# Patient Record
Sex: Female | Born: 1960 | Race: White | Hispanic: No | Marital: Single | Smoking: Never smoker
Health system: Southern US, Community
[De-identification: ages and names within clinical notes are randomized; demographics above are authoritative.]

## PROBLEM LIST (undated history)

## (undated) DIAGNOSIS — I10 Essential (primary) hypertension: Secondary | ICD-10-CM

## (undated) DIAGNOSIS — Z9289 Personal history of other medical treatment: Secondary | ICD-10-CM

## (undated) DIAGNOSIS — N950 Postmenopausal bleeding: Secondary | ICD-10-CM

## (undated) DIAGNOSIS — N840 Polyp of corpus uteri: Secondary | ICD-10-CM

## (undated) DIAGNOSIS — Z973 Presence of spectacles and contact lenses: Secondary | ICD-10-CM

## (undated) DIAGNOSIS — M47819 Spondylosis without myelopathy or radiculopathy, site unspecified: Secondary | ICD-10-CM

## (undated) DIAGNOSIS — K219 Gastro-esophageal reflux disease without esophagitis: Secondary | ICD-10-CM

## (undated) DIAGNOSIS — G43909 Migraine, unspecified, not intractable, without status migrainosus: Secondary | ICD-10-CM

---

## 2002-03-01 HISTORY — PX: LUMBAR DISC SURGERY: SHX700

## 2007-03-22 ENCOUNTER — Encounter: Admission: RE | Admit: 2007-03-22 | Discharge: 2007-03-22 | Payer: Self-pay

## 2007-06-23 ENCOUNTER — Ambulatory Visit (HOSPITAL_COMMUNITY): Admission: RE | Admit: 2007-06-23 | Discharge: 2007-06-24 | Payer: Self-pay | Admitting: Neurological Surgery

## 2007-06-23 HISTORY — PX: LUMBAR DISC SURGERY: SHX700

## 2007-07-06 ENCOUNTER — Encounter: Admission: RE | Admit: 2007-07-06 | Discharge: 2007-07-06 | Payer: Self-pay | Admitting: Anesthesiology

## 2007-07-13 ENCOUNTER — Encounter: Admission: RE | Admit: 2007-07-13 | Discharge: 2007-07-13 | Payer: Self-pay | Admitting: Neurological Surgery

## 2007-07-17 ENCOUNTER — Ambulatory Visit (HOSPITAL_COMMUNITY): Admission: RE | Admit: 2007-07-17 | Discharge: 2007-07-18 | Payer: Self-pay | Admitting: Anesthesiology

## 2007-07-17 HISTORY — PX: OTHER SURGICAL HISTORY: SHX169

## 2008-04-10 IMAGING — RF DG MYELOGRAM LUMBAR
14 of 16 series · 14 of 16 positions shown · IV contrast (omnipaque)
Comparison: none

CLINICAL DATA: Multiple lumbar surgeries. Low back and left leg pain.

LUMBAR MYELOGRAM:
CT LUMBAR SPINE WITH INTRATHECAL CONTRAST:
TECHNIQUE: An appropriate entry site was determined under fluoroscopy. Skin site
was marked, prepped with Betadine, and draped in usual sterile fashion, and
infiltrated locally with 1% lidocaine. A 22 gauge spinal needle was  advanced
into the thecal sac at L3-L4 from a left interlaminar approach towards the
midline. Clear colorless CSF returned. 17 ml Omnipaque 180 were administered
intrathecally for lumbar myelography, followed by axial CT scanning of the
lumbar spine. Coronal and sagittal reconstructions were generated from the axial
scan.

[Series 1: (hospital) · 1 of 1 slices shown]
[im 1/1]
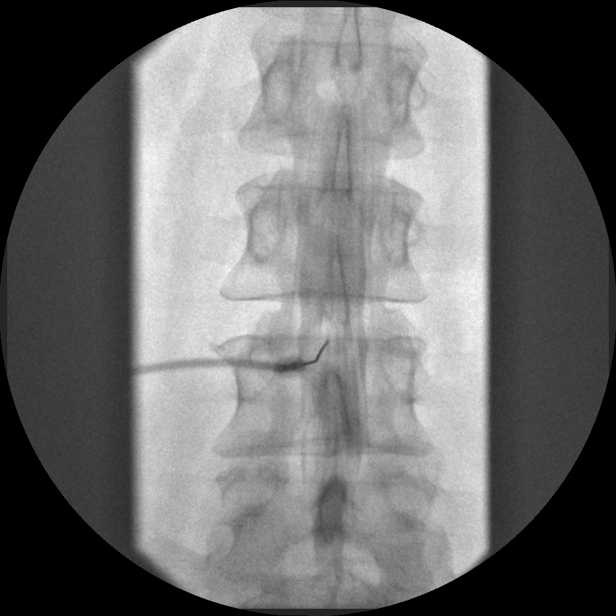

[Series 2: myelogram  white · 1 of 1 slices shown (1 of 13)]
[im 1/1]
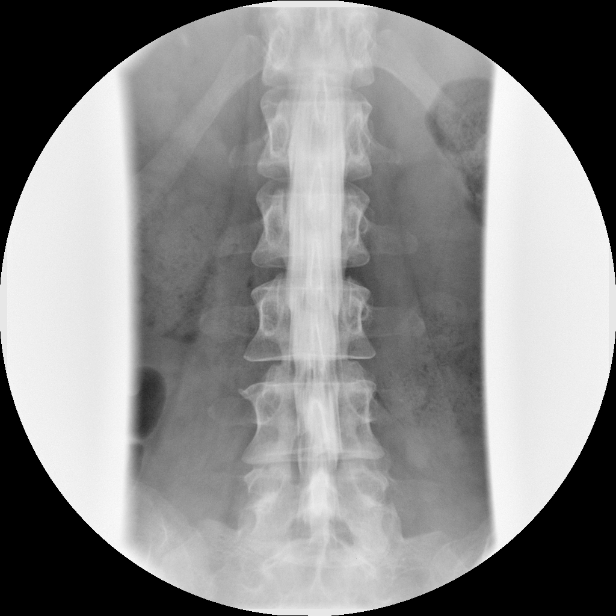

[Series 3: myelogram  white · 1 of 1 slices shown (2 of 13)]
[im 1/1]
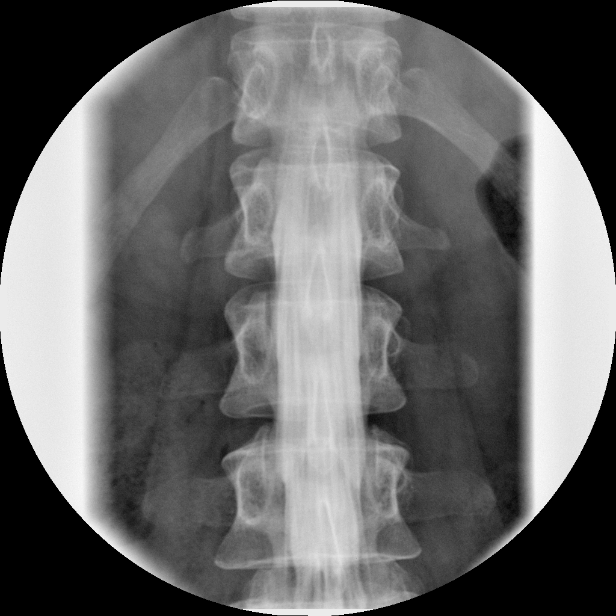

[Series 5: myelogram  white · 1 of 1 slices shown (3 of 13)]
[im 1/1]
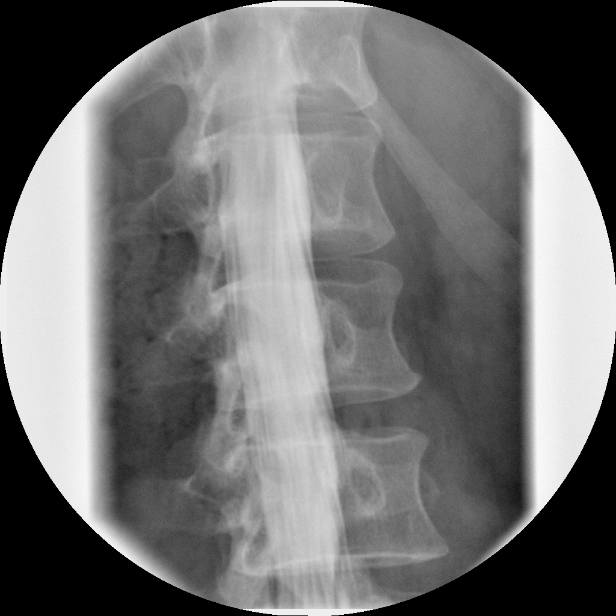

[Series 6: myelogram  white · 1 of 1 slices shown (4 of 13)]
[im 1/1]
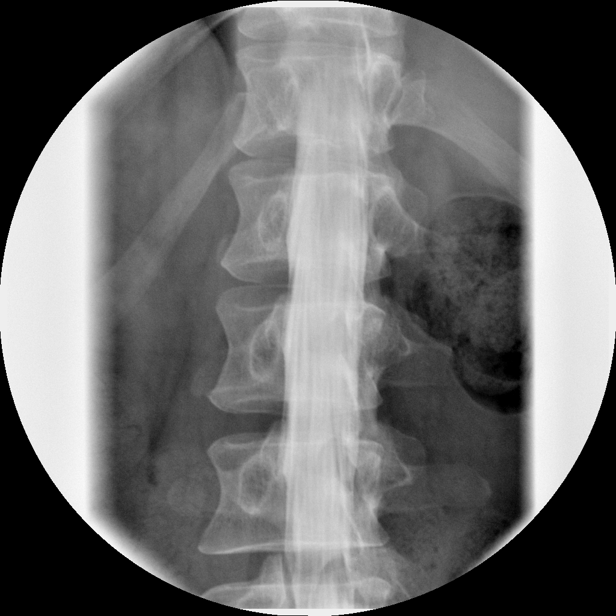

[Series 7: myelogram  white · 1 of 1 slices shown (5 of 13)]
[im 1/1]
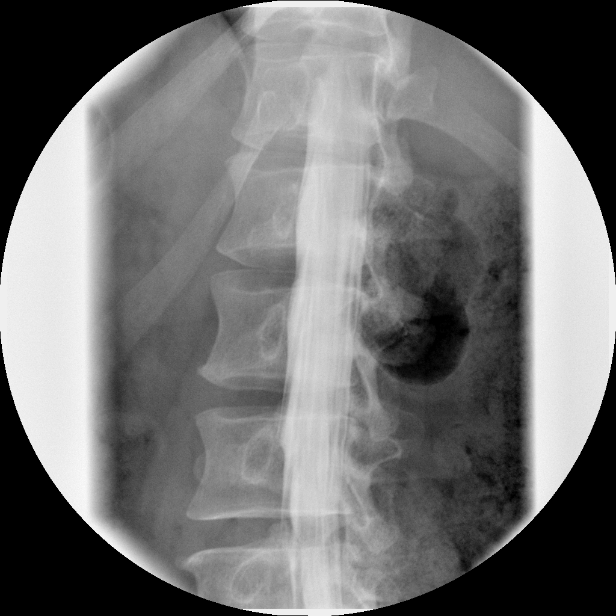

[Series 8: myelogram  white · 1 of 1 slices shown (6 of 13)]
[im 1/1]
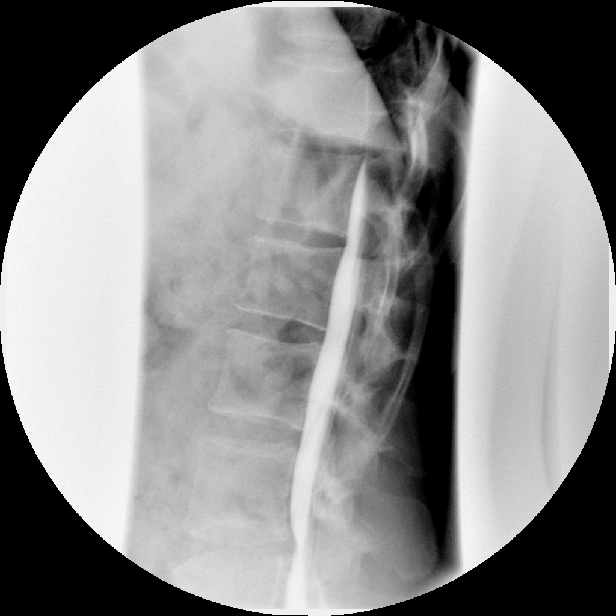

[Series 9: myelogram  white · 1 of 1 slices shown (7 of 13)]
[im 1/1]
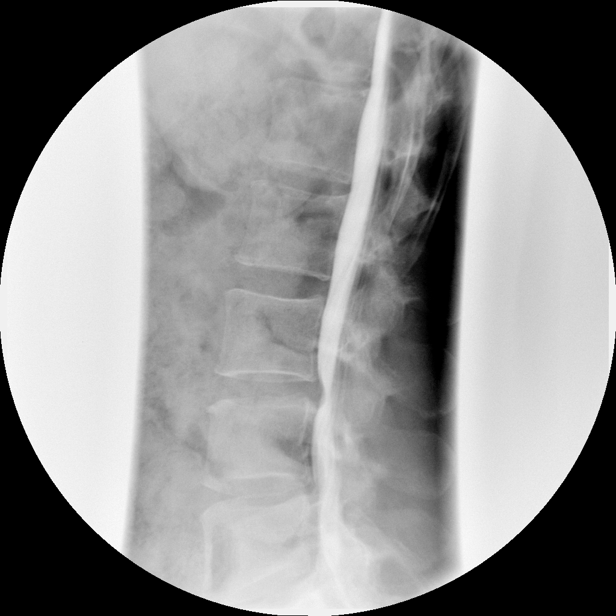

[Series 10: myelogram  white · 1 of 1 slices shown (8 of 13)]
[im 1/1]
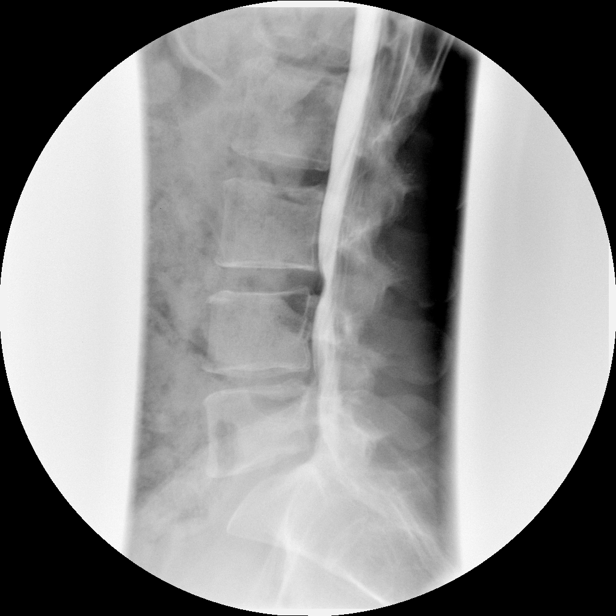

[Series 11: myelogram  white · 1 of 1 slices shown (9 of 13)]
[im 1/1]
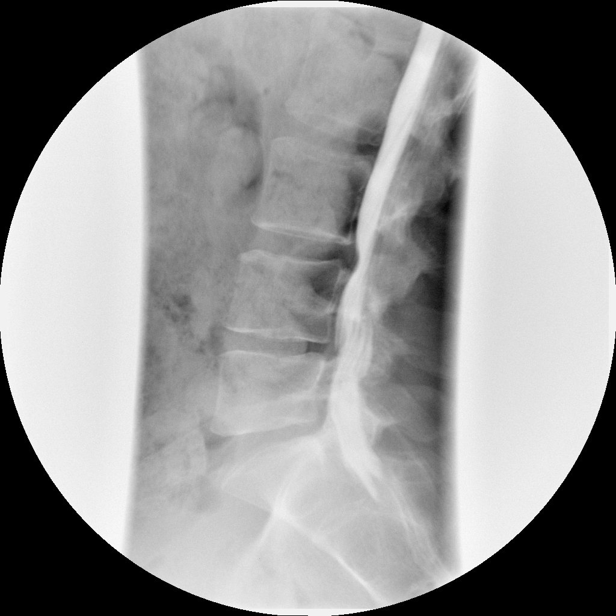

[Series 13: myelogram  white · 1 of 1 slices shown (10 of 13)]
[im 1/1]
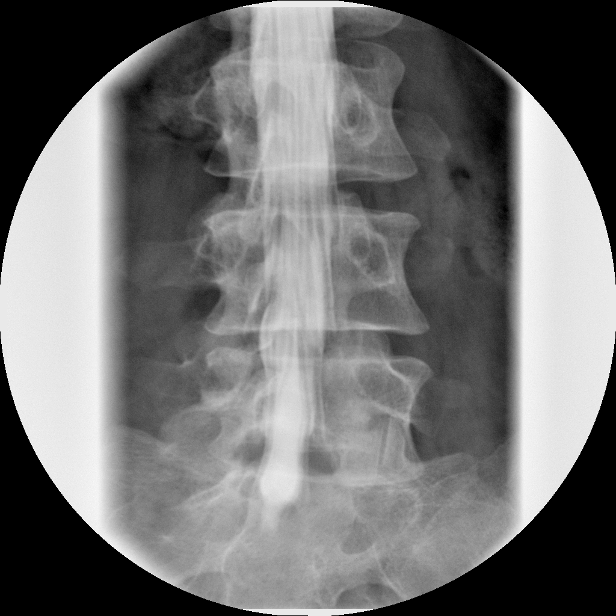

[Series 14: myelogram  white · 1 of 1 slices shown (11 of 13)]
[im 1/1]
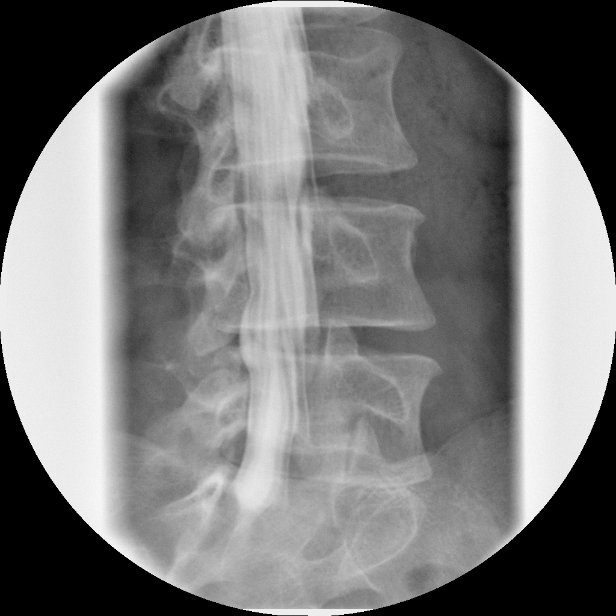

[Series 15: myelogram  white · 1 of 1 slices shown (12 of 13)]
[im 1/1]
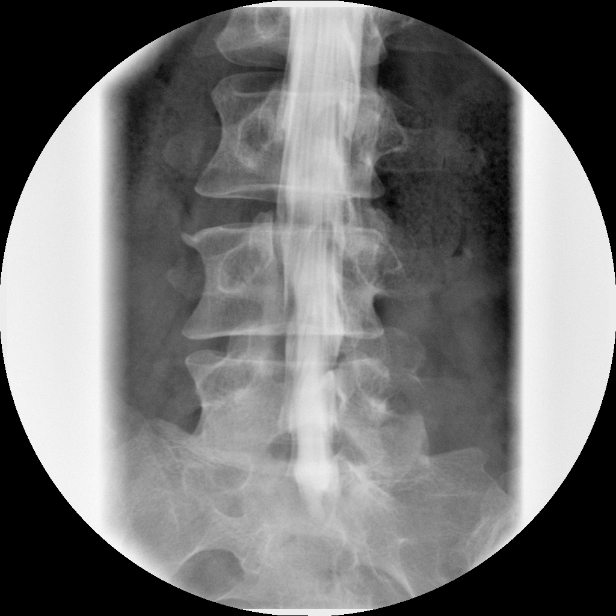

[Series 16: myelogram  white · 1 of 1 slices shown (13 of 13)]
[im 1/1]
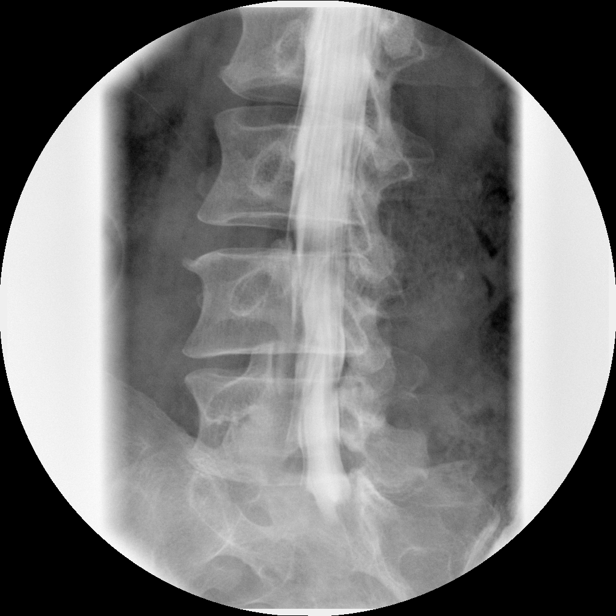

[14 of 16 positions shown; findings below may reference images not displayed]

FINDINGS: L1-L2: Normal conus at the interspace. No significant bulge or protrusion. No
spinal or foraminal stenosis.

L2-L3: Lipoma of the filum terminale. No significant disc bulge or protrusion.
No foraminal or spinal stenosis.

L3-L4: Broad central protrusion with foraminal component as well some early
subarticular recess stenosis, left greater than right. There is mild narrowing
of the spinal canal in the AP dimension. Early encroachment on neural foramina.

L4-L5: Circumferential disc bulge. Left posterolateral protrusion extending up
behind the L4 vertebral body. This does not appear to contact the exiting L4
nerve root but does displace posteriorly the left L5 nerve root at the level of
subarticular recess. Previous left laminotomy. The fat around the left L5 nerve
root in the lateral recess is obscured presumably related to scarring. Foramina
remain patent.

L5-S1: Circumferential disc bulge with central protrusion which extends down
behind S1 segment. This minimally distorts the anterior aspect of the thecal
sac. No spinal stenosis. Foramina widely patent.
IMPRESSION: 1. Broad protrusion L3-L4, which displaces the left L4 nerve root at the
subarticular recess.
2. Left posterolateral protrusion L4-L5 which displaces the left L5 nerve root
above the lateral recess.
3. Small central protrusion L5-S1 without significant compressive pathology.

## 2008-04-13 ENCOUNTER — Encounter: Admission: RE | Admit: 2008-04-13 | Discharge: 2008-04-13 | Payer: Self-pay | Admitting: Neurological Surgery

## 2009-04-23 ENCOUNTER — Encounter
Admission: RE | Admit: 2009-04-23 | Discharge: 2009-04-23 | Payer: Self-pay | Admitting: Physical Medicine and Rehabilitation

## 2010-12-02 HISTORY — PX: ANTERIOR CERVICAL DECOMP/DISCECTOMY FUSION: SHX1161

## 2011-03-16 NOTE — Op Note (Signed)
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   POSTOPERATIVE DIAGNOSIS:  Recurrent lumbar disk herniation L5-1 left  with back and left leg pain.   PROCEDURES:  Redo lumbar hemilaminectomy, medial facetectomy with redo  diskectomy L4-5 on the left utilizing microscopic dissection.   SURGEON:  Dr. Marikay Alar.   ASSISTANT:  Dr. Altamease Oiler.   ANESTHESIA:  General tracheal.   COMPLICATIONS:  None apparent.   INDICATIONS FOR PROCEDURE:  Ms. Stefanski is a 50 year old female who 3-1/2  weeks ago underwent a left L4-5 microdiskectomy.  She had undergone a  previous diskectomy L4-5 on the right L5-S1 on the right side.  Following surgery she had significant back pain with a recurrence of her  left leg pain.  She had MRI which suggested a small recurrence L4-5 on  the left side with some lateral recess stenosis and CT myelogram  confirmed this.  We recommended lumbar re-exploration with repeat  diskectomy L5 on the left.  She had mild to moderate lateral recess  stenosis at L3-4 on the left also with a broad-based disk herniation at  that level.  We talked about if we did not find much at L4-5 we would  extend our laminectomy up to L3-4 and do a diskectomy there but we felt  fairly convinced that her symptoms were coming from the L4-5 level only.  Therefore we planned on doing just L4-L5 unless we found no significant  pathology there, then we would do L3-4, she agreed with this plan.  She  understood the risks, benefits and expected outcome and wished to  proceed.   DESCRIPTION OF PROCEDURE:   The patient was taken to operating room after  induction of adequate generalized endotracheal anesthesia she was rolled  into prone position on the Wilson frame.  All pressure points were  padded.  Her lumbar region was prepped with DuraPrep and draped in usual  sterile fashion.  5 mL local anesthesia was injected and her old  incision was opened and the paraspinous muscular musculature was taken  down in subperiosteal fashion to expose L4-5 on the left.  There was  some early soft scar tissue which was removed with a Advertising copywriter.  I used the high-speed drill and the Kerrison punches to  widen the hemilaminectomy taking it superiorly and a little more  laterally.  I then was able to identify the L5 nerve root and the  pedicle.  I detethered the nerve root and we retracted this medially.  There was soft tissue which was somewhat lateral to the dura which I  felt was likely recurrent disk herniation and as I  retracted the dura  medially, I found more disk herniation.  I was able to remove this with  pituitary rongeur as the nerve root was held retracted with the repair  retractor.  I continued to bring down fragments from the midline.  Most  of it was in the midline.  I inspected the superior and inferior to the  disk space.  I palpated along the nerve root to assure adequate  decompression of nerve root once the disk space was thoroughly cleaned  out utilizing Epstein curettes and pituitary rongeur, I palpated with a  nerve hook into the midline superior and inferior to the disk space and  into the foramen to assure adequate decompression of the nerve root was  free and pulsatile.  We found no evidence of CSF leak.  The nerve root  looked healthy and free of damage.  I irrigated with copious amounts of  bacitracin containing saline solution, inspected the decompression once  again used Depo-Medrol over the nerve root.  I then lined this with  Duragen to help prevent epidural  fibrosis and then dried all bleeding  points and once hemostasis was achieved, closed the fascia with 0  Vicryl, closed subcutaneous and subcuticular tissues with 2-0 and 3-0  Vicryl and closed the skin with Benzoin Steri-Strips.  The drapes  removed.  Sterile dressing was applied.  The patient was awakened from  general anesthesia and transferred recovery room in stable condition.  At the end of procedure all sponge, needle and sponge counts were  correct.      Tia Alert, MD  Electronically Signed     DSJ/MEDQ  D:  07/17/2007  T:  07/18/2007  Job:  [id number]

## 2011-03-16 NOTE — Op Note (Signed)
NAMEKRISTLE, Ashley Kim                 ACCOUNT NO.:  [id number]   MEDICAL RECORD NO.:  [id number]          PATIENT TYPE:  AMB   LOCATION:  SDS                          FACILITY:  [facility]   PHYSICIAN:  Tia Alert, MD     DATE OF BIRTH:  13-Jun-1961   DATE OF PROCEDURE:  06/23/2007  DATE OF DISCHARGE:                               OPERATIVE REPORT   PREOPERATIVE DIAGNOSIS:  Spinal stenosis L4-L5 with lumbar disc  herniation and left L5 radiculopathy.   POSTOPERATIVE DIAGNOSIS:  Spinal stenosis L4-L5 with lumbar disc  herniation and left L5 radiculopathy.   PROCEDURE:  Lumbar hemilaminectomy, medial facetectomy, foraminotomy at  L4-L5 on the left, with microdiscectomy L5 on the left utilizing  microscopic dissection.   SURGEON:  Tia Alert, M.D.   ANESTHESIA:  General endotracheal anesthesia.   COMPLICATIONS:  None apparent.   INDICATIONS FOR PROCEDURE:  Ashley Kim is a 50 year old female who had  undergone two previous discectomies at L4-L5 and L5-S1 on the right by  Dr. Pryor Curia in 2003.  She had done quite well until more recently,  over the last few months she has developed left leg pain that follows an  L5 distribution.  This pain became quite severe and was unrelenting. She  had two MRIs about three months apart showing some spinal stenosis at L3-  L4 with disc desiccation at L3-L4, L4-L5, and L5-S1 with a large midline  disc herniation at L4-L5 causing central canal stenosis at L4-L5.  She  had some mild lateral recess stenosis at L5-S1. The pain she describes  seems to be an L5 radicular pattern. I recommended a microdiscectomy at  L4-L5 on the left side.  She understood the risks, benefits, and  expected outcome and wished to proceed.   DESCRIPTION OF PROCEDURE:  The patient was taken to the operating room  and after induction of adequate generalized endotracheal anesthesia, she  was rolled into the prone position on the Wilson frame.  All pressure  points were  padded.  The lumbar region was prepped with DuraPrep and  draped in the usual sterile fashion.  5 mL local anesthesia was injected  and a small dorsal midline incision was made and carried down to the  fascia.  The fascia was opened on the left side and the paraspinous  musculature was taken down in a subperiosteal fashion to expose L4-L5 on  the left.  Intraoperative x-ray confirmed my level and then I used a  combination of a high speed drill and Kerrison punches to perform a  hemilaminectomy, medial facetectomy, and foraminotomy at L4-L5 on the  left side.  She had significant overgrown facet in the lateral recess  that was overlying the L5 nerve root which seemed to have a high  takeoff. I undercut this facet, decompressed the lateral recess, and  decompressed the nerve.  I then followed the nerve out following the  medial pedicle wall until I was at the inferior edge of the pedicle  (removing the superior aspect of the lamina of L5).  Once the bony  decompression was  completeand the yellow ligament was removed, I had the  dura and L5 nerve root exposed.  I retracted the nerve root medially  with a Durico retractor and the epidural venous vasculature was then  coagulated and cut sharply.  The operating microscope was brought onto  the field.  The annulus was incised and thorough intradiscal discectomy  was performed with pituitary rongeurs and curets.  Once the discectomy  was complete, I palpated with coronary dilator into the midline and into  the foramen.  It seemed well decompressed. The annulus was sagging in  the midline.  I felt like I had a good decompression of the L5 nerve  root.  I then irrigated with saline solution containing bacitracin,  inspected our nerve root once again, dried all bleeding points with  bipolar cautery, and once meticulous hemostasis was achieved, closed the  fascia with 0 Vicryl, closed the subcutaneous and subcuticular tissues  with 2-0 and 3-0  Vicryl, closed the skin with Benzoin and Steri-Strips.  The drapes were removed.  A sterile dressing was applied.  The patient  was awakened from general anesthesia and transferred to the recovery  room in stable condition.  At the end of the procedure, all sponge,  needle and instrument counts were correct.      Tia Alert, MD  Electronically Signed     DSJ/MEDQ  D:  06/23/2007  T:  06/24/2007  Job:  [id number]

## 2011-06-02 HISTORY — PX: LUMBAR FUSION: SHX111

## 2011-08-12 LAB — CBC
HCT: 36.3
MCHC: 34.2
MCV: 90.7
Platelets: 234
RDW: 12.6
WBC: 5.9

## 2011-08-13 LAB — BASIC METABOLIC PANEL
BUN: 12
Calcium: 8.9
Chloride: 104
Creatinine, Ser: 0.63
GFR calc Af Amer: >60

## 2011-08-13 LAB — PROTIME-INR
INR: 0.9
Prothrombin Time: 12.7

## 2011-08-13 LAB — DIFFERENTIAL
Basophils Relative: 0
Eosinophils Absolute: 0
Lymphs Abs: 1.9
Monocytes Relative: 6
Neutro Abs: 4.7
Neutrophils Relative %: 66

## 2011-08-13 LAB — CBC
MCV: 89.4
Platelets: 294
RBC: 4.04
WBC: 7.2

## 2011-08-13 LAB — APTT: aPTT: 26

## 2015-08-13 ENCOUNTER — Other Ambulatory Visit: Payer: Self-pay | Admitting: Obstetrics and Gynecology

## 2015-08-13 NOTE — H&P (Signed)
  54 year old G 1 P 1 with postmenopausal bleeding. SHG +small polyps.  PMH: Unremarkable  PSH: C section Lumbar surgery Cervical surgery  Meds: Carvedilol Effexor Keppra Colace Prilosec  ALLERGIES: Wellbutrin  ROS unremarkable  Social history  No tobacco Alcohol or drugs  There were no vitals taken for this visit. General alert and oriented Lung CTAB Car RRR Abdomen is soft and non tender  IMPRESSION: Postmenopausal bleeding  PLAN: D and C Hysteroscopy  Risks reviewed Consent signed

## 2015-08-25 ENCOUNTER — Encounter (HOSPITAL_BASED_OUTPATIENT_CLINIC_OR_DEPARTMENT_OTHER): Payer: Self-pay | Admitting: *Deleted

## 2015-08-25 NOTE — Progress Notes (Signed)
NPO AFTER MN.  ARRIVE AT 0600. NEEDS ISTAT AND EKG.  WILL TAKE AM MEDS DOS W/ SIPS OF WATER.

## 2015-08-29 ENCOUNTER — Encounter (HOSPITAL_BASED_OUTPATIENT_CLINIC_OR_DEPARTMENT_OTHER): Payer: Self-pay | Admitting: *Deleted

## 2015-08-29 ENCOUNTER — Encounter (HOSPITAL_BASED_OUTPATIENT_CLINIC_OR_DEPARTMENT_OTHER)
Admission: RE | Disposition: A | Discharge status: Home / Self Care | Payer: Self-pay | Source: Ambulatory Visit | Attending: Obstetrics and Gynecology

## 2015-08-29 ENCOUNTER — Ambulatory Visit (HOSPITAL_BASED_OUTPATIENT_CLINIC_OR_DEPARTMENT_OTHER)
Admission: RE | Admit: 2015-08-29 | Discharge: 2015-08-29 | Disposition: A | Discharge status: Home / Self Care | Payer: BLUE CROSS/BLUE SHIELD | Source: Ambulatory Visit | Attending: Obstetrics and Gynecology | Admitting: Obstetrics and Gynecology

## 2015-08-29 ENCOUNTER — Ambulatory Visit (HOSPITAL_BASED_OUTPATIENT_CLINIC_OR_DEPARTMENT_OTHER): Payer: BLUE CROSS/BLUE SHIELD | Admitting: Anesthesiology

## 2015-08-29 DIAGNOSIS — F329 Major depressive disorder, single episode, unspecified: Secondary | ICD-10-CM | POA: Diagnosis not present

## 2015-08-29 DIAGNOSIS — M1990 Unspecified osteoarthritis, unspecified site: Secondary | ICD-10-CM | POA: Insufficient documentation

## 2015-08-29 DIAGNOSIS — I10 Essential (primary) hypertension: Secondary | ICD-10-CM | POA: Diagnosis not present

## 2015-08-29 DIAGNOSIS — N840 Polyp of corpus uteri: Secondary | ICD-10-CM | POA: Insufficient documentation

## 2015-08-29 DIAGNOSIS — K219 Gastro-esophageal reflux disease without esophagitis: Secondary | ICD-10-CM | POA: Insufficient documentation

## 2015-08-29 DIAGNOSIS — Z79899 Other long term (current) drug therapy: Secondary | ICD-10-CM | POA: Diagnosis not present

## 2015-08-29 HISTORY — DX: Postmenopausal bleeding: N95.0

## 2015-08-29 HISTORY — DX: Spondylosis without myelopathy or radiculopathy, site unspecified: M47.819

## 2015-08-29 HISTORY — PX: HYSTEROSCOPY WITH D & C: SHX1775

## 2015-08-29 HISTORY — DX: Essential (primary) hypertension: I10

## 2015-08-29 HISTORY — DX: Personal history of other medical treatment: Z92.89

## 2015-08-29 HISTORY — DX: Polyp of corpus uteri: N84.0

## 2015-08-29 HISTORY — DX: Migraine, unspecified, not intractable, without status migrainosus: G43.909

## 2015-08-29 HISTORY — DX: Gastro-esophageal reflux disease without esophagitis: K21.9

## 2015-08-29 HISTORY — DX: Presence of spectacles and contact lenses: Z97.3

## 2015-08-29 LAB — POCT I-STAT 4, (NA,K, GLUC, HGB,HCT)
GLUCOSE: 95 mg/dL (ref 65–99)
HEMATOCRIT: 35 % — AB (ref 36.0–46.0)
HEMOGLOBIN: 11.9 g/dL — AB (ref 12.0–15.0)
Potassium: 3.7 mmol/L (ref 3.5–5.1)
Sodium: 139 mmol/L (ref 135–145)

## 2015-08-29 SURGERY — DILATATION AND CURETTAGE /HYSTEROSCOPY
Anesthesia: Monitor Anesthesia Care | Site: Vagina

## 2015-08-29 MED ORDER — FENTANYL CITRATE (PF) 100 MCG/2ML IJ SOLN
INTRAMUSCULAR | Status: DC | PRN
  Administered 2015-08-29 (×2): 50 ug via INTRAVENOUS

## 2015-08-29 MED ORDER — CEFAZOLIN SODIUM-DEXTROSE 2-3 GM-% IV SOLR
2.0000 g | INTRAVENOUS | Status: AC
  Administered 2015-08-29: 2 g via INTRAVENOUS
  Filled 2015-08-29: qty 50

## 2015-08-29 MED ORDER — GLYCINE 1.5 % IR SOLN
Status: DC | PRN
  Administered 2015-08-29: 3000 mL

## 2015-08-29 MED ORDER — ACETAMINOPHEN 160 MG/5ML PO SOLN
325.0000 mg | ORAL | Status: DC | PRN
  Filled 2015-08-29: qty 20.3

## 2015-08-29 MED ORDER — LIDOCAINE HCL 1 % IJ SOLN
INTRAMUSCULAR | Status: DC | PRN
  Administered 2015-08-29: 10 mL

## 2015-08-29 MED ORDER — OXYCODONE HCL 5 MG/5ML PO SOLN
5.0000 mg | Freq: Once | ORAL | Status: DC | PRN
  Filled 2015-08-29: qty 5

## 2015-08-29 MED ORDER — KETOROLAC TROMETHAMINE 30 MG/ML IJ SOLN
INTRAMUSCULAR | Status: DC | PRN
  Administered 2015-08-29: 30 mg via INTRAVENOUS

## 2015-08-29 MED ORDER — ACETAMINOPHEN 325 MG PO TABS
325.0000 mg | ORAL_TABLET | ORAL | Status: DC | PRN
  Filled 2015-08-29: qty 2

## 2015-08-29 MED ORDER — FENTANYL CITRATE (PF) 100 MCG/2ML IJ SOLN
INTRAMUSCULAR | Status: AC
Start: 2015-08-29 — End: 2015-08-29
  Filled 2015-08-29: qty 2

## 2015-08-29 MED ORDER — LIDOCAINE HCL (CARDIAC) 20 MG/ML IV SOLN
INTRAVENOUS | Status: DC | PRN
  Administered 2015-08-29: 50 mg via INTRAVENOUS

## 2015-08-29 MED ORDER — FENTANYL CITRATE (PF) 100 MCG/2ML IJ SOLN
25.0000 ug | INTRAMUSCULAR | Status: DC | PRN
  Filled 2015-08-29: qty 1

## 2015-08-29 MED ORDER — DEXAMETHASONE SODIUM PHOSPHATE 4 MG/ML IJ SOLN
INTRAMUSCULAR | Status: DC | PRN
  Administered 2015-08-29: 10 mg via INTRAVENOUS

## 2015-08-29 MED ORDER — LACTATED RINGERS IV SOLN
INTRAVENOUS | Status: DC
  Administered 2015-08-29: 07:00:00 via INTRAVENOUS
  Filled 2015-08-29: qty 1000

## 2015-08-29 MED ORDER — MIDAZOLAM HCL 5 MG/5ML IJ SOLN
INTRAMUSCULAR | Status: DC | PRN
  Administered 2015-08-29: 2 mg via INTRAVENOUS

## 2015-08-29 MED ORDER — PROPOFOL 500 MG/50ML IV EMUL
INTRAVENOUS | Status: DC | PRN
  Administered 2015-08-29: 200 ug/kg/min via INTRAVENOUS

## 2015-08-29 MED ORDER — OXYCODONE HCL 5 MG PO TABS
5.0000 mg | ORAL_TABLET | Freq: Once | ORAL | Status: DC | PRN
  Filled 2015-08-29: qty 1

## 2015-08-29 MED ORDER — ONDANSETRON HCL 4 MG/2ML IJ SOLN
INTRAMUSCULAR | Status: DC | PRN
Start: 2015-08-29 — End: 2015-08-29
  Administered 2015-08-29: 4 mg via INTRAVENOUS

## 2015-08-29 MED ORDER — CEFAZOLIN SODIUM-DEXTROSE 2-3 GM-% IV SOLR
INTRAVENOUS | Status: AC
  Filled 2015-08-29: qty 50

## 2015-08-29 MED ORDER — MIDAZOLAM HCL 2 MG/2ML IJ SOLN
INTRAMUSCULAR | Status: AC
  Filled 2015-08-29: qty 2

## 2015-08-29 SURGICAL SUPPLY — 19 items
CANISTER SUCTION 2500CC (MISCELLANEOUS) ×2 IMPLANT
CATH ROBINSON RED A/P 16FR (CATHETERS) ×2 IMPLANT
COVER BACK TABLE 60X90IN (DRAPES) ×2 IMPLANT
DRAPE LG THREE QUARTER DISP (DRAPES) ×2 IMPLANT
DRSG TELFA 3X8 NADH (GAUZE/BANDAGES/DRESSINGS) ×4 IMPLANT
GLOVE BIO SURGEON STRL SZ 6.5 (GLOVE) ×4 IMPLANT
GLYCINE 1.5% IRRIG UROMATIC (IV SOLUTION) ×2 IMPLANT
GOWN STRL REUS W/ TWL LRG LVL3 (GOWN DISPOSABLE) ×2 IMPLANT
GOWN STRL REUS W/TWL LRG LVL3 (GOWN DISPOSABLE) ×4
KIT ROOM TURNOVER WOR (KITS) ×2 IMPLANT
LEGGING LITHOTOMY PAIR STRL (DRAPES) ×2 IMPLANT
NEEDLE SPNL 25GX3.5 QUINCKE BL (NEEDLE) ×2 IMPLANT
PACK BASIN DAY SURGERY FS (CUSTOM PROCEDURE TRAY) ×2 IMPLANT
SYR CONTROL 10ML LL (SYRINGE) ×2 IMPLANT
TOWEL OR 17X24 6PK STRL BLUE (TOWEL DISPOSABLE) ×4 IMPLANT
TRAY DSU PREP LF (CUSTOM PROCEDURE TRAY) ×2 IMPLANT
TUBING AQUILEX INFLOW (TUBING) ×2 IMPLANT
TUBING AQUILEX OUTFLOW (TUBING) ×2 IMPLANT
WATER STERILE IRR 500ML POUR (IV SOLUTION) ×2 IMPLANT

## 2015-08-29 NOTE — Brief Op Note (Signed)
08/29/2015  7:59 AM  PATIENT:  Ashley Kim  54 y.o. female  PRE-OPERATIVE DIAGNOSIS:  Postmenopausal bleeding Endometrial Polyp POST-OPERATIVE DIAGNOSIS:  Same  PROCEDURE:  Procedure(s): DILATATION AND CURETTAGE /HYSTEROSCOPY (N/A)  SURGEON:  Surgeon(s) and Role:    * Marcelle OverlieMichelle Nikira Kushnir, MD - Primary  PHYSICIAN ASSISTANT:   ASSISTANTS: none   ANESTHESIA:   paracervical block and MAC  EBL:     BLOOD ADMINISTERED:none  DRAINS: none   LOCAL MEDICATIONS USED:  LIDOCAINE   SPECIMEN:  Source of Specimen:  uterine currettings  DISPOSITION OF SPECIMEN:  PATHOLOGY  COUNTS:  YES  TOURNIQUET:  * No tourniquets in log *  DICTATION: .Other Dictation: Dictation Number [id number]  PLAN OF CARE: Discharge to home after PACU  PATIENT DISPOSITION:  PACU - hemodynamically stable.   Delay start of Pharmacological VTE agent (>24hrs) due to surgical blood loss or risk of bleeding: not applicable

## 2015-08-29 NOTE — Anesthesia Procedure Notes (Signed)
Procedure Name: MAC Date/Time: 08/29/2015 7:40 AM Performed by: Tyrone NineSAUVE, Jazzlyn Huizenga F Pre-anesthesia Checklist: Patient identified, Timeout performed, Emergency Drugs available, Suction available and Patient being monitored Patient Re-evaluated:Patient Re-evaluated prior to inductionOxygen Delivery Method: Nasal cannula Preoxygenation: Pre-oxygenation with 100% oxygen Intubation Type: IV induction Placement Confirmation: positive ETCO2 Dental Injury: Teeth and Oropharynx as per pre-operative assessment

## 2015-08-29 NOTE — Transfer of Care (Signed)
Immediate Anesthesia Transfer of Care Note  Patient: Ashley MurrainKaren Kim  Procedure(s) Performed: Procedure(s): DILATATION AND CURETTAGE /HYSTEROSCOPY (N/A)  Patient Location: PACU  Anesthesia Type:MAC  Level of Consciousness: awake, alert , oriented and patient cooperative  Airway & Oxygen Therapy: Patient Spontanous Breathing and Patient connected to nasal cannula oxygen  Post-op Assessment: Report given to RN and Post -op Vital signs reviewed and stable  Post vital signs: Reviewed and stable  Last Vitals: There were no vitals filed for this visit.  Complications: No apparent anesthesia complications

## 2015-08-29 NOTE — Discharge Instructions (Signed)

## 2015-08-29 NOTE — Progress Notes (Signed)
H and P on the chart No significant changes Will proceed with D and C, HSC Consent signed

## 2015-08-29 NOTE — Op Note (Signed)
[name]:  Studstill, Marilena                 ACCOUNT NO.:  [id number]  MEDICAL RECORD NO.:  [id number]  LOCATION:                               [facility]  PHYSICIAN:  Arminda Foglio L. Duston Smolenski, M.D.DATE OF BIRTH:  [birth year]  DATE OF PROCEDURE:  08/29/2015 DATE OF DISCHARGE:  08/29/2015                              OPERATIVE REPORT   PREOPERATIVE DIAGNOSIS:  Postmenopausal bleeding and endometrial polyp.  POSTOPERATIVE DIAGNOSIS:  Postmenopausal bleeding and endometrial polyp.  PROCEDURE:  D and C, hysteroscopy, and removal of endometrial tissue.  SURGEON:  Emalia Witkop L. Vincente PoliGrewal, M.D.  ANESTHESIA:  MAC with paracervical.  EBL:  Minimal.  COMPLICATIONS:  None.  PATHOLOGY:  Uterine curettings.  DESCRIPTION OF PROCEDURE:  The patient was taken to the operating room. Anesthesia was administered.  She was prepped and draped in usual sterile fashion.  In-and-out catheter was used to empty the bladder.  A speculum was placed in the vagina.  The cervix was grasped with a tenaculum and a paracervical block was performed.  The cervical internal os was dilated gently with Shawnie PonsPratt dilators.  The diagnostic hysteroscope was inserted into the uterine cavity with excellent visualization, we could see 2 small endometrial polyps that were attached to the posterior wall of the uterus.  This was consistent with a preoperative sonohysterogram.  A sharp curette was inserted.  The uterus was thoroughly curetted of all tissue.  Polyp forceps were used to remove the remainder of the tissue.  We then reinserted the hysteroscope.  The uterine cavity was clean.  The fluid deficit was approximately 4 mL. All instruments were removed from the vagina.  All sponge, lap, and instrument counts were correct x2.  The patient went to recovery room in stable condition.     Marchia Diguglielmo L. Vincente PoliGrewal, M.D.     Florestine AversMLG/MEDQ  D:  08/29/2015  T:  08/29/2015  Job:  [id number]

## 2015-08-29 NOTE — Anesthesia Postprocedure Evaluation (Signed)
  Anesthesia Post-op Note  Patient: Ashley MurrainKaren Weisner  Procedure(s) Performed: Procedure(s): DILATATION AND CURETTAGE /HYSTEROSCOPY (N/A)  Patient Location: PACU  Anesthesia Type:MAC  Level of Consciousness: awake  Airway and Oxygen Therapy: Patient Spontanous Breathing  Post-op Pain: none  Post-op Assessment: Post-op Vital signs reviewed, Patient's Cardiovascular Status Stable, Respiratory Function Stable, Patent Airway, No signs of Nausea or vomiting and Pain level controlled              Post-op Vital Signs: Reviewed and stable  Last Vitals:  Filed Vitals:   08/29/15 0900  BP:   Pulse: 66  Temp:   Resp: 19    Complications: No apparent anesthesia complications

## 2015-08-29 NOTE — Anesthesia Preprocedure Evaluation (Addendum)
Anesthesia Evaluation  Patient identified by MRN, date of birth, ID band Patient awake    Reviewed: Allergy & Precautions, NPO status , Patient's Chart, lab work & pertinent test results  History of Anesthesia Complications Negative for: history of anesthetic complications  Airway Mallampati: II  TM Distance: >3 FB Neck ROM: Full    Dental  (+) Teeth Intact, Dental Advisory Given   Pulmonary neg pulmonary ROS,    breath sounds clear to auscultation       Cardiovascular Exercise Tolerance: Good hypertension, Pt. on medications and Pt. on home beta blockers (-) angina(-) Past MI and (-) CHF negative cardio ROS  (-) dysrhythmias  Rhythm:Regular Rate:Normal     Neuro/Psych  Headaches, neg Seizures PSYCHIATRIC DISORDERS Depression    GI/Hepatic Neg liver ROS, GERD  Medicated and Controlled,  Endo/Other  negative endocrine ROS  Renal/GU negative Renal ROS     Musculoskeletal  (+) Arthritis , Osteoarthritis,    Abdominal   Peds  Hematology negative hematology ROS (+)   Anesthesia Other Findings   Reproductive/Obstetrics                          Anesthesia Physical Anesthesia Plan  ASA: II  Anesthesia Plan: MAC   Post-op Pain Management:    Induction: Intravenous  Airway Management Planned: Nasal Cannula  Additional Equipment: None  Intra-op Plan:   Post-operative Plan:   Informed Consent: I have reviewed the patients History and Physical, chart, labs and discussed the procedure including the risks, benefits and alternatives for the proposed anesthesia with the patient or authorized representative who has indicated his/her understanding and acceptance.   Dental advisory given  Plan Discussed with: CRNA and Surgeon  Anesthesia Plan Comments:         Anesthesia Quick Evaluation

## 2015-09-01 ENCOUNTER — Encounter (HOSPITAL_BASED_OUTPATIENT_CLINIC_OR_DEPARTMENT_OTHER): Payer: Self-pay | Admitting: Obstetrics and Gynecology

## 2016-09-08 ENCOUNTER — Ambulatory Visit (INDEPENDENT_AMBULATORY_CARE_PROVIDER_SITE_OTHER): Payer: BLUE CROSS/BLUE SHIELD

## 2016-09-08 ENCOUNTER — Ambulatory Visit (INDEPENDENT_AMBULATORY_CARE_PROVIDER_SITE_OTHER): Payer: BLUE CROSS/BLUE SHIELD | Admitting: Podiatry

## 2016-09-08 ENCOUNTER — Encounter: Payer: Self-pay | Admitting: Podiatry

## 2016-09-08 VITALS — BP 142/82 | HR 76 | Resp 18

## 2016-09-08 DIAGNOSIS — R52 Pain, unspecified: Secondary | ICD-10-CM

## 2016-09-08 DIAGNOSIS — M21622 Bunionette of left foot: Secondary | ICD-10-CM

## 2016-09-08 DIAGNOSIS — M71572 Other bursitis, not elsewhere classified, left ankle and foot: Secondary | ICD-10-CM

## 2016-09-08 DIAGNOSIS — M7752 Other enthesopathy of left foot: Secondary | ICD-10-CM

## 2016-09-08 DIAGNOSIS — M79672 Pain in left foot: Secondary | ICD-10-CM

## 2016-09-08 NOTE — Patient Instructions (Signed)
Today your exam an x-ray demonstrated a Taylor's bunion, bunionette. Kenalog 10 was injected around that area. If this treatment resolves your problem no follow-up needed, if not return for follow-up

## 2016-09-08 NOTE — Progress Notes (Signed)
   Subjective:    Patient ID: Ashley Kim, female    DOB: 06/10/1961, 55 y.o.   MRN: [id number]  HPI     This patient presents today complaining of approximately 6 month history of pain in or around the left fifth MPJ area. She describes the symptoms occurring on and off weightbearing, with direct shoe pressure and even uncomfortable at night. She describes the pain as sharp, shooting, throbbing as well as a dull ache. Patient has changed her shoes, soak your foot, used ice, heat and the symptoms have persisted with slight increase in intensity over this past 6 months. Patient denies any direct injury or change of activity. She also describes some general achiness and other peripheral joints in the morning lasting 30-60 minutes. She only complains of ongoing pain in or around the fifth left MPJ area.  Patient is a self-employed Garment/textile technologistnurse anesthetist  Review of Systems  Musculoskeletal: Positive for back pain.  Neurological: Positive for headaches.  Hematological: Bruises/bleeds easily.       Objective:   Physical Exam  BP 142/82  Orientated 3 Pulse 76 Respiration 18  Vascular: No peripheral edema bilaterally No calf tenderness bilaterally DP and PT pulses 2/4 bilaterally Capillary reflex immediate bilaterally  Neurological: Sensation to 10 g monofilament wire intact 5/5 bilaterally Vibratory sensation reactive bilaterally Ankle reflex equal and reactive bilaterally  Dermatological: Texture and turgor within normal limits No open skin lesions bilaterally  Musculoskeletal: Manual motor testing dorsi flexion, plantar flexion, inversion, eversion 5/5 bilaterally Bunionette's bilaterally Palpable tenderness and diffuse mild fibrous thickening lateral fifth MPJ which is tender to dorsal plantar and lateral pressure. The lateral fifth MPJ is tender to direct palpation The medial fifth MPJ is also tender direct palpation. There is no tenderness on range of motion of the fifth  MPJ. Symptoms of generalized pain were duplicated with palpation around the fifth left MPJ area without any paresthesia complaint  X-ray examination weightbearing left foot dated 09/08/2016  Intact bony structure without fracture and/or dislocation HAV Bunionette Bone density adequate Joint space adequate No increased soft tissue density  Radiographic impression: No acute bony abnormality noted in the weightbearing x-ray dated 09/08/2016            Assessment & Plan:   Assessment: Bunionette/with associated fibrosis or bursitis fifth MPJ  Plan: I discussed results of examine x-ray with patient today and offered her a Kenalog injection in or around the fifth MPJ area. Patient verbally consents  Skin is prepped with alcohol and Betadine and 10 mg of Kenalog mixed with 5 mg of plain Sensorcaine were injected around the dorsal, medial, plantar aspect of fifth MPJ. The area was aspirated and no fluid withdrawn. Patient tolerated injection without any difficulty  Patient will return if the symptoms do not improve with the local corticosteroid injection. Instructed patient to wear shoes that do not irritate the fifth left MPJ area. Also, I discussed with patient that if she does return and describes other further joint discomfort would order a baseline arthritic panel  Reappoint at patient's request

## 2016-10-27 ENCOUNTER — Ambulatory Visit: Payer: BLUE CROSS/BLUE SHIELD | Admitting: Podiatry

## 2018-03-31 ENCOUNTER — Other Ambulatory Visit: Payer: Self-pay | Admitting: Otolaryngology

## 2018-03-31 DIAGNOSIS — H906 Mixed conductive and sensorineural hearing loss, bilateral: Secondary | ICD-10-CM

## 2018-05-05 ENCOUNTER — Ambulatory Visit
Admission: RE | Admit: 2018-05-05 | Discharge: 2018-05-05 | Disposition: A | Discharge status: Home / Self Care | Payer: BLUE CROSS/BLUE SHIELD | Source: Ambulatory Visit | Attending: Otolaryngology | Admitting: Otolaryngology

## 2018-05-05 DIAGNOSIS — H906 Mixed conductive and sensorineural hearing loss, bilateral: Secondary | ICD-10-CM

## 2020-05-16 ENCOUNTER — Other Ambulatory Visit: Payer: Self-pay | Admitting: Orthopedic Surgery

## 2020-05-16 DIAGNOSIS — M5416 Radiculopathy, lumbar region: Secondary | ICD-10-CM

## 2020-06-29 ENCOUNTER — Ambulatory Visit
Admission: RE | Admit: 2020-06-29 | Discharge: 2020-06-29 | Disposition: A | Discharge status: Home / Self Care | Payer: No Typology Code available for payment source | Source: Ambulatory Visit | Attending: Orthopedic Surgery | Admitting: Orthopedic Surgery

## 2020-06-29 DIAGNOSIS — M5416 Radiculopathy, lumbar region: Secondary | ICD-10-CM

## 2020-06-29 MED ORDER — GADOBENATE DIMEGLUMINE 529 MG/ML IV SOLN
15.0000 mL | Freq: Once | INTRAVENOUS | Status: AC | PRN
  Administered 2020-06-29: 15 mL via INTRAVENOUS

## 2024-06-24 ENCOUNTER — Emergency Department (HOSPITAL_BASED_OUTPATIENT_CLINIC_OR_DEPARTMENT_OTHER)
Admission: EM | Admit: 2024-06-24 | Discharge: 2024-06-24 | Disposition: A | Discharge status: Home / Self Care | Payer: PRIVATE HEALTH INSURANCE

## 2024-06-24 ENCOUNTER — Encounter (HOSPITAL_BASED_OUTPATIENT_CLINIC_OR_DEPARTMENT_OTHER): Payer: Self-pay | Admitting: Emergency Medicine

## 2024-06-24 ENCOUNTER — Other Ambulatory Visit: Payer: Self-pay

## 2024-06-24 ENCOUNTER — Emergency Department (HOSPITAL_BASED_OUTPATIENT_CLINIC_OR_DEPARTMENT_OTHER): Payer: PRIVATE HEALTH INSURANCE

## 2024-06-24 DIAGNOSIS — R109 Unspecified abdominal pain: Secondary | ICD-10-CM | POA: Insufficient documentation

## 2024-06-24 DIAGNOSIS — M545 Low back pain, unspecified: Secondary | ICD-10-CM | POA: Insufficient documentation

## 2024-06-24 LAB — URINALYSIS, ROUTINE W REFLEX MICROSCOPIC
Bilirubin Urine: NEGATIVE
Glucose, UA: NEGATIVE mg/dL
Hgb urine dipstick: NEGATIVE
Ketones, ur: NEGATIVE mg/dL
Nitrite: NEGATIVE
Protein, ur: NEGATIVE mg/dL
Specific Gravity, Urine: 1.02 (ref 1.005–1.030)
pH: 7 (ref 5.0–8.0)

## 2024-06-24 LAB — CBC WITH DIFFERENTIAL/PLATELET
Abs Immature Granulocytes: 0.01 K/uL (ref 0.00–0.07)
Basophils Absolute: 0 K/uL (ref 0.0–0.1)
Basophils Relative: 1 %
Eosinophils Absolute: 0 K/uL (ref 0.0–0.5)
Eosinophils Relative: 1 %
HCT: 33 % — ABNORMAL LOW (ref 36.0–46.0)
Hemoglobin: 10.5 g/dL — ABNORMAL LOW (ref 12.0–15.0)
Immature Granulocytes: 0 %
Lymphocytes Relative: 46 %
Lymphs Abs: 2.3 K/uL (ref 0.7–4.0)
MCH: 25.5 pg — ABNORMAL LOW (ref 26.0–34.0)
MCHC: 31.8 g/dL (ref 30.0–36.0)
MCV: 80.3 fL (ref 80.0–100.0)
Monocytes Absolute: 0.5 K/uL (ref 0.1–1.0)
Monocytes Relative: 9 %
Neutro Abs: 2.2 K/uL (ref 1.7–7.7)
Neutrophils Relative %: 43 %
Platelets: 236 K/uL (ref 150–400)
RBC: 4.11 MIL/uL (ref 3.87–5.11)
RDW: 14.8 % (ref 11.5–15.5)
WBC: 5 K/uL (ref 4.0–10.5)
nRBC: 0 % (ref 0.0–0.2)

## 2024-06-24 LAB — COMPREHENSIVE METABOLIC PANEL WITH GFR
ALT: 28 U/L (ref 0–44)
AST: 25 U/L (ref 15–41)
Albumin: 4.1 g/dL (ref 3.5–5.0)
Alkaline Phosphatase: 82 U/L (ref 38–126)
Anion gap: 10 (ref 5–15)
BUN: 18 mg/dL (ref 8–23)
CO2: 26 mmol/L (ref 22–32)
Calcium: 8.9 mg/dL (ref 8.9–10.3)
Chloride: 104 mmol/L (ref 98–111)
Creatinine, Ser: 0.72 mg/dL (ref 0.44–1.00)
GFR, Estimated: >60 mL/min (ref >60)
Glucose, Bld: 83 mg/dL (ref 70–99)
Potassium: 3.8 mmol/L (ref 3.5–5.1)
Sodium: 140 mmol/L (ref 135–145)
Total Bilirubin: 0.2 mg/dL (ref 0.0–1.2)
Total Protein: 6.5 g/dL (ref 6.5–8.1)

## 2024-06-24 LAB — URINALYSIS, MICROSCOPIC (REFLEX): RBC / HPF: NONE SEEN RBC/hpf (ref 0–5)

## 2024-06-24 LAB — LIPASE, BLOOD: Lipase: 48 U/L (ref 11–51)

## 2024-06-24 MED ORDER — LIDOCAINE 5 % EX PTCH
1.0000 | MEDICATED_PATCH | CUTANEOUS | Status: DC
  Administered 2024-06-24: 1 via TRANSDERMAL
  Filled 2024-06-24: qty 1

## 2024-06-24 MED ORDER — KETOROLAC TROMETHAMINE 15 MG/ML IJ SOLN
15.0000 mg | Freq: Once | INTRAMUSCULAR | Status: AC
  Administered 2024-06-24: 15 mg via INTRAVENOUS
  Filled 2024-06-24: qty 1

## 2024-06-24 MED ORDER — OXYCODONE HCL 5 MG PO TABS
5.0000 mg | ORAL_TABLET | Freq: Once | ORAL | Status: AC
  Administered 2024-06-24: 5 mg via ORAL
  Filled 2024-06-24: qty 1

## 2024-06-24 MED ORDER — CYCLOBENZAPRINE HCL 10 MG PO TABS: 10.0000 mg | ORAL_TABLET | Freq: Two times a day (BID) | ORAL | 0 refills | Status: AC | PRN

## 2024-06-24 MED ORDER — CYCLOBENZAPRINE HCL 5 MG PO TABS
5.0000 mg | ORAL_TABLET | Freq: Once | ORAL | Status: AC
  Administered 2024-06-24: 5 mg via ORAL
  Filled 2024-06-24: qty 1

## 2024-06-24 NOTE — ED Provider Notes (Signed)
 [place] [facility] Provider Note   CSN: [id number] Arrival date & time: 06/24/24  1127     Patient presents with: Flank Pain   Ashley Kim is a 63 y.o. female.    Flank Pain   Patient presents because of left flank/lower back pain has been present since Tuesday.  She states that she is has a history of back pain but this feels little different and has been very consistent.  Patient states that hurts with certain movements.  She is not having any midline back pain.  No bowel bladder continence.  No saddle anesthesia.  No numbness or tingling going down her legs.  Patient states that she feels like it is more in her lower left back/flank area.  No dysuria.  No hematuria.  No nausea vomit diarrhea.  Bowel moods been normal.  Otherwise, denies all complaints.  Has been taking a combination of Tylenol  and ibuprofen.      Prior to Admission medications   Medication Sig Start Date End Date Taking? Authorizing Provider  cyclobenzaprine  (FLEXERIL ) 10 MG tablet Take 1 tablet (10 mg total) by mouth 2 (two) times daily as needed for muscle spasms. 06/24/24  Yes Simon Lavonia SAILOR, MD  carvedilol (COREG) 12.5 MG tablet Take 12.5 mg by mouth 2 (two) times daily with a meal.    [provider]  docusate sodium (COLACE) 50 MG capsule Take 50-100 mg by mouth daily.    [provider]  levETIRAcetam (KEPPRA) 1000 MG tablet Take 1,000 mg by mouth at bedtime.    [provider]  omeprazole (PRILOSEC) 10 MG capsule Take 10 mg by mouth every morning.    [provider]  venlafaxine XR (EFFEXOR-XR) 150 MG 24 hr capsule Take 300 mg by mouth daily with breakfast.    [provider]    Allergies: Wellbutrin [bupropion]    Review of Systems  Genitourinary:  Positive for flank pain.    Updated Vital Signs BP (!) 148/103 (BP Location: Right Arm)   Pulse 80   Temp 98.2 F (36.8 C) (Oral)   Resp 18   Ht 5' 4 (1.626 m)   Wt  74.8 kg   SpO2 94%   BMI 28.32 kg/m   Physical Exam Vitals and nursing note reviewed.  Constitutional:      General: She is not in acute distress.    Appearance: She is well-developed.  HENT:     Head: Normocephalic and atraumatic.  Eyes:     Conjunctiva/sclera: Conjunctivae normal.  Cardiovascular:     Rate and Rhythm: Normal rate and regular rhythm.     Heart sounds: No murmur heard. Pulmonary:     Effort: Pulmonary effort is normal. No respiratory distress.     Breath sounds: Normal breath sounds.  Abdominal:     Palpations: Abdomen is soft.     Tenderness: There is no abdominal tenderness.  Musculoskeletal:        General: No swelling.       Arms:     Cervical back: Neck supple.  Skin:    General: Skin is warm and dry.     Capillary Refill: Capillary refill takes less than 2 seconds.  Neurological:     Mental Status: She is alert.  Psychiatric:        Mood and Affect: Mood normal.     (all labs ordered are listed, but only abnormal results are displayed) Labs Reviewed  CBC WITH DIFFERENTIAL/PLATELET - Abnormal;  Notable for the following components:      Result Value   Hemoglobin 10.5 (*)    HCT 33.0 (*)    MCH 25.5 (*)    All other components within normal limits  URINALYSIS, ROUTINE W REFLEX MICROSCOPIC - Abnormal; Notable for the following components:   Leukocytes,Ua TRACE (*)    All other components within normal limits  URINALYSIS, MICROSCOPIC (REFLEX) - Abnormal; Notable for the following components:   Bacteria, UA RARE (*)    All other components within normal limits  COMPREHENSIVE METABOLIC PANEL WITH GFR  LIPASE, BLOOD    EKG: None  Radiology: CT Renal Stone Study Result Date: 06/24/2024 CLINICAL DATA:  Left flank pain. EXAM: CT ABDOMEN AND PELVIS WITHOUT CONTRAST TECHNIQUE: Multidetector CT imaging of the abdomen and pelvis was performed following the standard protocol without IV contrast. RADIATION DOSE REDUCTION: This exam was performed  according to the departmental dose-optimization program which includes automated exposure control, adjustment of the mA and/or kV according to patient size and/or use of iterative reconstruction technique. COMPARISON:  None Available. FINDINGS: Evaluation of this exam is limited in the absence of intravenous contrast. Lower chest: The visualized lung bases are clear. No intra-abdominal free air or free fluid. Hepatobiliary: The liver is unremarkable. No dilatation. The gallbladder is unremarkable. Pancreas: Unremarkable. No pancreatic ductal dilatation or surrounding inflammatory changes. Spleen: Normal in size without focal abnormality. Adrenals/Urinary Tract: The adrenal glands unremarkable. The kidneys, visualized ureters, and urinary bladder appear unremarkable. Stomach/Bowel: There is large amount of stool throughout the colon. There is no bowel obstruction or active inflammation. The appendix is normal. Vascular/Lymphatic: Mild aortoiliac atherosclerotic disease. The IVC is unremarkable. No portal venous gas. There is no adenopathy. Reproductive: The uterus is grossly unremarkable. No suspicious adnexal masses. Other: None Musculoskeletal: Degenerative changes of the spine. No acute osseous pathology. IMPRESSION: 1. No acute intra-abdominal or pelvic pathology. No hydronephrosis or nephrolithiasis. 2. Constipation. No bowel obstruction. Normal appendix. 3.  Aortic Atherosclerosis (ICD10-I70.0). Electronically Signed   By: Vanetta Chou M.D.   On: 06/24/2024 12:35     Procedures   Medications Ordered in the ED  lidocaine  (LIDODERM ) 5 % 1 patch (1 patch Transdermal Patch Applied 06/24/24 1237)  ketorolac  (TORADOL ) 15 MG/ML injection 15 mg (15 mg Intravenous Given 06/24/24 1235)  oxyCODONE  (Oxy IR/ROXICODONE ) immediate release tablet 5 mg (5 mg Oral Given 06/24/24 1339)  cyclobenzaprine  (FLEXERIL ) tablet 5 mg (5 mg Oral Given 06/24/24 1339)                                    Medical Decision  Making Amount and/or Complexity of Data Reviewed Labs: ordered. Radiology: ordered.  Risk Prescription drug management.   Patient presents because of left flank/lower back pain has been present since Tuesday.  She states that she is has a history of back pain but this feels little different and has been very consistent.  Patient states that hurts with certain movements.  She is not having any midline back pain.  No bowel bladder continence.  No saddle anesthesia.  No numbness or tingling going down her legs.  Patient states that she feels like it is more in her lower left back/flank area.  No dysuria.  No hematuria.  No nausea vomit diarrhea.  Bowel moods been normal.  Otherwise, denies all complaints.  Has been taking a combination of Tylenol  and ibuprofen.   Upon exam, patient imminently  stable.  Afebrile.  Normotensive.   Patient has reproducible back pain/flank pain.  Laboratory workup unremarkable.  UA showed minimal bacteria.  Trace leuk esterase.  No nitrites.  No urinary symptoms.  Do not think this is pyelonephritis or other infectious etiology.  No indication to start antibiotics.  Did obtain CT scan renal study.  Will rule out any evidence of nephrolithiasis.  This is unremarkable.   Currently, no concerns for any vascular pathology.  No concerns for the kidney or aortic pathology patient  neurovascularly intact in the lower extremities.  No indication for vessel imaging.  No rash to be concerning for herpes zoster.   I think this is likely musculoskeletal pain.  Recommended Tylenol , ibuprofen as well as lidocaine  patches.  Patient will follow-up with physical therapy.  No bowel or bladder incontinence.  No concerns for cauda equina or other central cord pathology     Final diagnoses:  Acute left-sided low back pain without sciatica    ED Discharge Orders          Ordered    cyclobenzaprine  (FLEXERIL ) 10 MG tablet  2 times daily PRN        06/24/24 1427                Simon Lavonia SAILOR, MD 06/24/24 1500

## 2024-06-24 NOTE — Discharge Instructions (Addendum)
 For pain, you can take 1000 mg of Tylenol  or 1 g of Tylenol  every 6-8 hours.  Do not exceed more than 4000 mg or 4 g in a 24-hour period.  You can also take ibuprofen 600 to 800 mg every 6-8 hours as well.  Do not take this high-dose ibuprofen for greater than a week.   Please use the lidocaine  patches. You can take the flexeril  as well. Do not drive with this medication.   Please follow up with physical therapy.   If you have any fever/chills worsening pain then please come back to the ED for further evaluation.

## 2024-06-24 NOTE — ED Triage Notes (Signed)
 Pt c/o LT flank pain since Tues; thought it was muscular, but nothing is helping and pain is worsening
